# Patient Record
Sex: Male | Born: 1945 | Race: White | Hispanic: No | Marital: Married | Smoking: Never smoker
Health system: Southern US, Community
[De-identification: ages and names within clinical notes are randomized; demographics above are authoritative.]

## PROBLEM LIST (undated history)

## (undated) DIAGNOSIS — C9110 Chronic lymphocytic leukemia of B-cell type not having achieved remission: Secondary | ICD-10-CM

## (undated) DIAGNOSIS — Q231 Congenital insufficiency of aortic valve: Secondary | ICD-10-CM

## (undated) DIAGNOSIS — I2510 Atherosclerotic heart disease of native coronary artery without angina pectoris: Secondary | ICD-10-CM

## (undated) DIAGNOSIS — Z951 Presence of aortocoronary bypass graft: Secondary | ICD-10-CM

## (undated) HISTORY — DX: Presence of aortocoronary bypass graft: Z95.1

## (undated) HISTORY — DX: Chronic lymphocytic leukemia of B-cell type not having achieved remission: C91.10

## (undated) HISTORY — DX: Congenital insufficiency of aortic valve: Q23.1

## (undated) HISTORY — DX: Atherosclerotic heart disease of native coronary artery without angina pectoris: I25.10

---

## 1989-10-30 DIAGNOSIS — I2510 Atherosclerotic heart disease of native coronary artery without angina pectoris: Secondary | ICD-10-CM

## 1989-10-30 HISTORY — DX: Atherosclerotic heart disease of native coronary artery without angina pectoris: I25.10

## 1994-10-30 DIAGNOSIS — Q2381 Bicuspid aortic valve: Secondary | ICD-10-CM

## 1994-10-30 DIAGNOSIS — Q231 Congenital insufficiency of aortic valve: Secondary | ICD-10-CM

## 1994-10-30 HISTORY — DX: Congenital insufficiency of aortic valve: Q23.1

## 1994-10-30 HISTORY — DX: Bicuspid aortic valve: Q23.81

## 1999-04-20 ENCOUNTER — Encounter: Payer: Self-pay | Admitting: Endocrinology

## 1999-04-20 ENCOUNTER — Ambulatory Visit (HOSPITAL_COMMUNITY): Admission: RE | Admit: 1999-04-20 | Discharge: 1999-04-20 | Payer: Self-pay | Admitting: Endocrinology

## 2001-04-03 ENCOUNTER — Other Ambulatory Visit: Admission: RE | Admit: 2001-04-03 | Discharge: 2001-04-03 | Payer: Self-pay | Admitting: Oncology

## 2004-08-30 ENCOUNTER — Ambulatory Visit: Payer: Self-pay | Admitting: Oncology

## 2004-09-15 ENCOUNTER — Ambulatory Visit: Payer: Self-pay | Admitting: Family Medicine

## 2004-12-06 ENCOUNTER — Ambulatory Visit: Payer: Self-pay | Admitting: Family Medicine

## 2005-02-03 ENCOUNTER — Ambulatory Visit: Payer: Self-pay | Admitting: Family Medicine

## 2005-02-07 ENCOUNTER — Ambulatory Visit: Payer: Self-pay | Admitting: Family Medicine

## 2005-03-16 ENCOUNTER — Ambulatory Visit: Payer: Self-pay | Admitting: Hematology and Oncology

## 2005-08-18 ENCOUNTER — Ambulatory Visit: Payer: Self-pay | Admitting: Family Medicine

## 2005-09-05 ENCOUNTER — Ambulatory Visit: Payer: Self-pay | Admitting: Oncology

## 2005-09-28 ENCOUNTER — Ambulatory Visit: Payer: Self-pay | Admitting: Family Medicine

## 2005-10-12 ENCOUNTER — Ambulatory Visit: Payer: Self-pay | Admitting: Family Medicine

## 2005-10-26 ENCOUNTER — Ambulatory Visit: Payer: Self-pay | Admitting: Family Medicine

## 2005-11-28 ENCOUNTER — Ambulatory Visit: Payer: Self-pay | Admitting: Family Medicine

## 2006-02-20 ENCOUNTER — Ambulatory Visit: Payer: Self-pay | Admitting: Oncology

## 2006-08-07 ENCOUNTER — Ambulatory Visit: Payer: Self-pay | Admitting: Oncology

## 2006-11-07 ENCOUNTER — Ambulatory Visit: Payer: Self-pay | Admitting: Cardiology

## 2006-11-07 ENCOUNTER — Inpatient Hospital Stay (HOSPITAL_COMMUNITY): Admission: AD | Admit: 2006-11-07 | Discharge: 2006-11-13 | Payer: Self-pay | Admitting: Cardiology

## 2006-11-28 ENCOUNTER — Ambulatory Visit: Payer: Self-pay

## 2007-01-22 ENCOUNTER — Ambulatory Visit: Payer: Self-pay | Admitting: Oncology

## 2007-02-13 ENCOUNTER — Ambulatory Visit: Payer: Self-pay | Admitting: Internal Medicine

## 2007-07-09 ENCOUNTER — Ambulatory Visit: Payer: Self-pay | Admitting: Oncology

## 2007-12-18 ENCOUNTER — Ambulatory Visit: Payer: Self-pay

## 2008-04-17 ENCOUNTER — Ambulatory Visit: Payer: Self-pay | Admitting: Internal Medicine

## 2009-02-18 ENCOUNTER — Encounter (INDEPENDENT_AMBULATORY_CARE_PROVIDER_SITE_OTHER): Payer: Self-pay | Admitting: *Deleted

## 2009-04-03 DIAGNOSIS — I2510 Atherosclerotic heart disease of native coronary artery without angina pectoris: Secondary | ICD-10-CM | POA: Insufficient documentation

## 2009-04-03 DIAGNOSIS — I4891 Unspecified atrial fibrillation: Secondary | ICD-10-CM

## 2009-04-06 ENCOUNTER — Ambulatory Visit: Payer: Self-pay | Admitting: Internal Medicine

## 2009-10-11 ENCOUNTER — Encounter: Payer: Self-pay | Admitting: Internal Medicine

## 2009-10-11 ENCOUNTER — Ambulatory Visit: Payer: Self-pay

## 2010-05-13 ENCOUNTER — Ambulatory Visit: Payer: Self-pay | Admitting: Internal Medicine

## 2010-12-01 NOTE — Procedures (Signed)
Summary: [facility]iology Device Clinic    Allergies: No Known Drug Allergies  PPM Specifications Following MD:  Sherryl Manges, MD     Referring MD:  DHATT PPM Vendor:  Medtronic     PPM Model Number:  [id number]     PPM Serial Number:  [id number] PPM DOI:  11/09/2006     PPM Implanting MD:  Sherryl Manges, MD  Lead 1    Location: RA     DOI: 11/09/2006     Model #: 4098     Serial #: [id number]     Status: active Lead 2    Location: RV     DOI: 11/09/2006     Model #: 6213     Serial #: [id number]     Status: active  Magnet Response Rate:  BOL 85 ERI 65  Indications:  BRADY   PPM Follow Up Remote Check?  No Battery Voltage:  2.77 V     Battery Est. Longevity:  5 YEARS     Pacer Dependent:  Yes       PPM Device Measurements Atrium  Amplitude: 2.8 mV, Impedance: 406 ohms,  Right Ventricle  Impedance: 549 ohms, Threshold: 0.5 V at 0.4 msec  Episodes MS Episodes:  357     Percent Mode Switch:  90.9%     Coumadin:  Yes Ventricular High Rate:  0     Atrial Pacing:  5.0%     Ventricular Pacing:  99.3%  Parameters Mode:  DDIR     Lower Rate Limit:  60     Upper Rate Limit:  120 Paced AV Delay:  150     Sensed AV Delay:  OFF Next Cardiology Appt Due:  10/30/2010 Tech Comments:  No parameter changes.  Device function normal.  A-fib with controlled  ventricular rates, + coumadin.  ROV 6 months [facility]. Altha Harm, LPN  May 13, 2010 9:45 AM

## 2010-12-01 NOTE — Cardiovascular Report (Signed)
Summary: Office Visit   Office Visit   Imported By: Roderic Ovens 11/04/2009 14:01:29  _____________________________________________________________________  External Attachment:    Type:   Image     Comment:   External Document

## 2010-12-27 ENCOUNTER — Encounter (INDEPENDENT_AMBULATORY_CARE_PROVIDER_SITE_OTHER): Payer: PRIVATE HEALTH INSURANCE

## 2010-12-27 ENCOUNTER — Encounter: Payer: Self-pay | Admitting: Internal Medicine

## 2010-12-27 DIAGNOSIS — I498 Other specified cardiac arrhythmias: Secondary | ICD-10-CM

## 2011-01-05 NOTE — Procedures (Signed)
Summary: Cardiology Device Clinic  Medications Added METOPROLOL SUCCINATE 100 MG XR24H-TAB (METOPROLOL SUCCINATE) take one and one half by mouth daily CRESTOR 20 MG TABS (ROSUVASTATIN CALCIUM) one by mouth daily      Allergies Added: NKDA  Current Medications (verified): 1)  Isosorbide Mononitrate Cr 30 Mg Xr24h-Tab (Isosorbide Mononitrate) .... Take 3 Tablets Once Daily 2)  Coumadin 4 Mg Tabs (Warfarin Sodium) .... As Directed 3)  Lantus 100 Unit/ml Soln (Insulin Glargine) .... Use As Directed 4)  Aspirin 81 Mg Tabs (Aspirin) .... Take One Tablet Once Daily 5)  Lipitor 40 Mg Tabs (Atorvastatin Calcium) .... Take One Tablet Once Daily 6)  Metoprolol Succinate 100 Mg Xr24h-Tab (Metoprolol Succinate) .... Take One and One Half By Mouth Daily 7)  Wellbutrin Sr 200 Mg Xr12h-Tab (Bupropion Hcl) .... Take One Tablet Once Daily 8)  Fish Oil  Oil (Fish Oil) .... Take Once Daily 9)  Lisinopril 20 Mg Tabs (Lisinopril) .... Take One Tablet Once Daily 10)  Humalog 100 Unit/ml Soln (Insulin Lispro (Human)) .... As Directed 11)  Crestor 20 Mg Tabs (Rosuvastatin Calcium) .... One By Mouth Daily  Allergies (verified): No Known Drug Allergies  PPM Specifications Following MD:  Sherryl Manges, MD     Referring MD:  DHATT PPM Vendor:  Medtronic     PPM Model Number:  [id number]     PPM Serial Number:  [id number] PPM DOI:  11/09/2006     PPM Implanting MD:  Sherryl Manges, MD  Lead 1    Location: RA     DOI: 11/09/2006     Model #: [id number]     Serial #: [id number]     Status: active Lead 2    Location: RV     DOI: 11/09/2006     Model #: 3875     Serial #: [id number]     Status: active  Magnet Response Rate:  BOL 85 ERI 65  Indications:  BRADY   PPM Follow Up Pacer Dependent:  Yes      Episodes Coumadin:  Yes  Parameters Mode:  DDIR     Lower Rate Limit:  60     Upper Rate Limit:  120 Paced AV Delay:  150     Sensed AV Delay:  OFF Tech Comments:  See PaceArt

## 2011-01-26 NOTE — Cardiovascular Report (Signed)
Summary: Office Visit   Office Visit   Imported By: Roderic Ovens 01/17/2011 09:08:08  _____________________________________________________________________  External Attachment:    Type:   Image     Comment:   External Document

## 2011-03-14 NOTE — Assessment & Plan Note (Signed)
[facility]                         ELECTROPHYSIOLOGY OFFICE NOTE   NAME:Cobbins, HIROYUKI OZANICH                         MRN:          [id number]  DATE:04/17/2008                            DOB:          Dec 19, 1945    HISTORY OF PRESENT ILLNESS:  Mr. Proehl is seen in followup for his  pacemaker implanted about a year and half ago.  He saw Dr. Sherlyn Lick a few  weeks ago, who told that his rhythm was not normal.  As noted, he came  in without significant changes in his exercise capacity.   CURRENT MEDICATIONS:  Include isosorbide 90, Coumadin, Lantus, Humalog,  metoprolol 50 b.i.d., bupropion, and lisinopril.   PHYSICAL EXAMINATION:  VITAL SIGNS:  Today, his blood pressure was  96/60, his pulse was 72, but his blood pressures at home had been in the  160s/80s.  LUNGS:  His lungs were clear.  NECK:  His neck veins were flat.  HEART:  His heart sounds were regular with a mechanical S2 and an early  systolic murmur.  ABDOMEN:  Soft with active bowel sounds.  EXTREMITIES:  Femoral pulses were not examined.  Distal pulses were  intact.  There was no clubbing, cyanosis, or edema.  NEUROLOGICAL:  Grossly normal.   Interrogation of the Medtronic Adapta pulse generator demonstrates a P-  wave of 2.8 with impedance of 510.  This was a fibrillation wave.  The R-  wave was 8 with impedance of 525 with threshold of 0.5 and 0.4.  Battery  voltage was 2.77.   IMPRESSION:  1. Atrial fibrillation - not persistent.  2. Bradycardia, status post pacemaker.  3. Aortic valve replacement.  4. Coronary artery disease with prior bypass surgery.  5. CLL.  6. Diabetes.   Mr. Harb has atrial fibrillation.  He has not had significant symptoms  with it as best as I can tell.  I will need to check about this with Dr.  Sherlyn Lick.  In the event that he does, cardioversion would be reasonable and  then long-term therapy would be considered in the context of his  comorbidities.   I will  plan to talk to Dr. Sherlyn Lick about him on Monday.   For now, his pacemaker was functioning well and we let him go home.    Duke Salvia, MD, [facility]  Electronically Signed   SCK/MedQ  DD: 04/17/2008  DT: 04/18/2008  Job #: [id number]

## 2011-03-14 NOTE — Letter (Signed)
May 13, 2010    Dr. Lenard Cunningham Dhatt  [address]  [place], [place]  [address]   RE:  [name], [name]  MRN:  [id number]  /  DOB:  Jan 31, 1946   Dear [name]:   I hope this letter finds you and your family well this summer.  Mr.  Cunningham was seen today in followup for pacemaker implantation for sinus  node dysfunction in the setting of ischemic heart disease and prior  bypass surgery and aortic valve replacement.  The patient has no  significant complaints.  He denies chest pain, shortness of breath or  fatigue.   He does snore.  He is treated for sleep apnea and was recently adjusted  with his mask.  This is notable because as I walked into the examination  room, he was asleep.   On examination, his blood pressure was mildly elevated today at 155/79.  His pulse was 62.  His weight was 210.  His neck veins were flat.  His  lungs were clear.  Heart sounds were regular without murmurs or gallops.  Extremities had trace edema.   His medications, I should note, include isosorbide 90, Coumadin, Lantus,  aspirin, Crestor, metoprolol succinate 150, Wellbutrin, lisinopril 20  and Humalog insulin.   Interrogation of his Medtronic adapter pulse generator demonstrates that  he is ventricularly paced nearly 100% of the time.  He is in permanent  atrial fibrillation.  His heart rate excursion was somewhat blunted.   IMPRESSION:  1. Atrial fibrillation - permanent with a blunted ventricular      response.  2. Status post pacer for the above.  3. Ischemic heart disease with normal left ventricular function prior      to aortic valve replacement.  4. Obstructive sleep apnea with some ongoing daytime somnolence.   [name], [name] is doing quite well.  We did reprogram his device to  try to improve his heart rate excursion.  He says his sleep apnea mask  was adjusted within the year but, based on coming in and seeing him  asleep in the exam room, I wonder whether it may not be worth having  somebody look at that again.   We will see him again in 6 months' time.    Sincerely,      Duke Salvia, MD, [facility]  Electronically Signed    SCK/MedQ  DD: 05/13/2010  DT: 05/13/2010  Job #: [id number]

## 2011-03-17 NOTE — Discharge Summary (Signed)
[name], [name] NO.:  [id number]   MEDICAL RECORD NO.:  [id number]          PATIENT TYPE:  INP   LOCATION:  4713                         FACILITY:  [facility]   PHYSICIAN:  Duke Salvia, MD, FACCDATE OF BIRTH:  Mar 05, 1946   DATE OF ADMISSION:  11/07/2006  DATE OF DISCHARGE:  11/13/2006                               DISCHARGE SUMMARY   CARDIOLOGIST:  Dr. Thereasa Solo Dhatt in [place].   ELECTROPHYSIOLOGIST:  Dr. Sherryl Manges.   PRIMARY CARE PHYSICIAN:  Dr. Dina Rich.   REASON FOR ADMISSION:  Chest pain and shortness of breath.   DISCHARGE DIAGNOSES:  1. Coronary artery disease, status post coronary artery bypass graft.      a.     History of PCI to the left anterior descending in 1992.  2. Cardiac catheterization this admission revealing 4 of 4 grafts      patent - medical therapy recommended.  3. Preserved left ventricular function.  4. Bicuspid aortic valve, status post aortic valve replacement 1996.  5. 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      a.     Status post permanent pacemaker implantation this admission.      b.     Status post atrial lead revisions secondary to atrial lead       dislodgment this admission.  6. Diabetes mellitus type 2 complicated by retinopathy, neuropathy and      nephropathy.  7. Chronic lymphocytic lymphoma.  8. Elevated TSH.   PROCEDURES PERFORMED THIS ADMISSION:  1. Cardiac catheterization by Dr. Shawnie Pons, November 08, 2006,      please see his dictated note for complete details.  Briefly, this      revealed continued patency of the internal mammary to the LAD with      diffuse distal LAD disease.  There was continued patency of the      saphenous vein grafts to the diagonal with diffuse diagonal      disease.  There was continued patency of the vein graft to the      obtuse marginal and the posterolateral/OM3 with mild luminal      irregularity in this graft and probable occlusion of the  distal      limb into the PDA, which itself may be undersupplied.  He was not      felt to be a candidate for PCI and medical therapy was recommended.  2. Permanent pacemaker implantation by Dr. Sherryl Manges, November 09, 2006 with a Medtronic device.  3. Atrial lead revision by Dr. Sharrell Ku secondary to atrial lead      dislodgement, November 12, 2006.   HISTORY:  [name] is a 65 year old male patient with a history of  coronary disease, status post CBG, as well as bicuspid aortic valve,  status post aortic valve replacement on chronic Coumadin therapy, who  was admitted to [facility] with complaints of numbness in his  upper left lip, left arm and left leg.  He also had complaints of chest  tightness.  His  CKs were elevated without significant MB fraction.  His  troponins were negative.  His BNP was slightly elevated as well at 461.  There was some concern that he had some mild degree of congestive heart  failure.  It was also concerning that his chest pain was consistent with  exertional angina.  He was treated with Lasix.  He underwent stress  Myoview study.  The test had to be stopped secondary to fatigue and  hypotension.  He was only to bring his heart rate from 45 to 80 beats  per minute, which was only 50% of his maximum predicted heart rate.  He  was subsequently transferred to [facility] for further  evaluation and treatment.   HOSPITAL COURSE:  The patient's Coumadin had been held and his INR was  subtherapeutic and he was taken for cardiac catheterization on November 08, 2006 with Dr. Riley Kill.  Please see his dictated note for complete  details.  The anatomy of this is as described above.  He was not felt to  be a candidate for PCI of any vessel.  If PCI was considered, he would  need a drug-eluting stent given his history of diabetes and this would  be difficult given his need for longterm Coumadin.  Therefore, medical  therapy was warranted.   Given his bradycardia and significant first-  degree AV block, out EP service was asked to see the patient.  The  patient was set up for an MRA for his head  and neck.  This revealed  intracranial atherosclerotic changes without significant carotid or  basilar atherosclerotic change.  This was done in response to the  patient's recent history of left-sided numbness.  Dr. Graciela Husbands saw the  patient for our EP service.  He recommended the MRA as noted above.  He  also recommended permanent pacemaker implantation and medical therapy  for his coronary disease with increased beta blocker after pacemaker  implantation.  Pacemaker was implanted by Dr. Graciela Husbands on November 09, 2006  with a Medtronic device.  The patient's Coumadin was restarted.  On  interrogation of the pacemaker on January 12, it was noted that the  atrial lead appeared to have dislodged.  Dr. Ladona Ridgel saw the patient and  concurred and set the patient up for atrial lead revision on November 12, 2006.  Atrial lead revision was carried out by Dr. Ladona Ridgel on November 12, 2006.  Interrogation of the pacemaker postoperatively indicated that it  was functioning appropriately.  The patient was seen in followup by Dr.  Graciela Husbands on November 13, 2006.  He felt that the patient was stable enough  for discharge to home.  He would not need any bridging heparin or  Lovenox as his prosthetic valve was in the aortic valve position.  He  did recommend a high dose of Coumadin 15 mg tonight, as well as 15 mg  tomorrow and then resume his regular regimen.  The patient would need to  have his INR followed up on Friday, January 18, with his primary care  physician who usually follows his Coumadin, that is Dr. Sol Passer.  The  patient, at discharge, was noted to still be on Lasix.  This was started  when he was at [facility] for suspected congestive heart failure. His creatinine and BUN were noted to be elevated.  Therefore, his Lasix  was discontinued at  discharge and it was recommended that he have a BMET  drawn when he sees his  primary care physician in a couple of days to  have his INR drawn.  He has been provided with a prescription to take to  his primary care physician and that lab result can be called to Dr.  Graciela Husbands.  The patient was started on Lipitor when he was at [facility].  This is continued at discharge.  It is also recommended he be  on an aspirin and this continued at discharge.  His quinapril was  previously 40 mg a day.  This was adjusted at [facility] and he  is currently on enalapril 5 mg a day.  He has been provided with a new  prescription for enalapril 5 mg a day and has been asked to continue  this dosage for now.  His Toprol has been increased to 150 mg a day and  he has been provided with a new prescription for this as well.  As noted  above, Dr. Graciela Husbands saw the patient and felt he was stable enough for  discharge to home today.   LABS AND ANCILLARY DATA:  On January 12, white count 8100, hemoglobin  12.9, hematocrit 36.9, platelet count 154,000.  INR, at discharge, 1.7.  On January 13, sodium 132, potassium 3.9, chloride 104, CO2 23, glucose  151, BUN 40, creatinine 1.63.  Total bilirubin, on January 11, 1.6,  alkaline phosphatase 56, AST 28, ALT 60, total protein 6.3, albumin 3.5,  calcium 8.6, magnesium 2.2.  BNP, on January 9, 130.  Total cholesterol  302, triglycerides 243, HDL 33, LDL 220.  TSH 6.173.  Chest x-ray,  November 13, 2006, no complications status post pacemaker implantation.  Cardiac enlargement without failure.  Chest x-ray, November 10, 2006, no  evidence for pneumothorax after pace replacement.  Cardiac enlargement  without failure.   DISCHARGE MEDICATIONS:  1. Isosorbide mononitrate 90 mg daily.  2. Toprol-XL 100 mg 1-1/2 tablets daily to make 150 mg dose.  3. Enalapril 5 mg daily (this is new and takes the place of his      quinapril).  4. Coumadin 15 mg tonight and 15 mg  tomorrow November 14, 2006 and then      he is to resume his previous dose of Coumadin.  5. Lantus as taken at home.  6. Humalog as directed.  7. Bupropion as taken at home.  8. Aspirin 81 mg daily.  9. Lipitor 80 mg q.h.s.   DIET:  Low-fat, low-sodium diabetic diet.   ACTIVITY:  He is to follow the instructions provided to him that go over  activity post pacemaker implantation.   WOUND CARE:  The patient has also been provided with instructions on  what to do for wound care post pacemaker implantation.  If he notes any  groin, swelling, bleeding, bruising or fever he is to call our office.   FOLLOWUP:  1. The patient is to see Dr. Sol Passer his primary care physician, Friday,      November 16, 2006 to check his PT and INR.  He has been asked to      call for an appointment.  He has also been provided with a      prescription to take that day to have a BMET drawn to reassess his     renal function.  2. He should follow up with Dr. Sherlyn Lick in 1-2 weeks.  He has been asked      to call him for an appointment.  3. He will see the [facility] for  a wound check on Thursday,      January 24, at 8:30 a.m. at our [place] office in [place].  4. He will see Dr. Graciela Husbands in 3 months and the office will contact him      for an appointment.   The patient has been advised to stop taking quinapril and Actos for now.  He should discuss with Dr. Sol Passer whether or not to restart his Actos.   Total physician and PA time, on this discharge, 90 minutes.      Tereso Newcomer, PA-C      Duke Salvia, MD, [facility]  Electronically Signed    SW/MEDQ  D:  11/13/2006  T:  11/14/2006  Job:  [id number]   cc:   Dina Rich

## 2011-03-17 NOTE — Op Note (Signed)
NAMEJUERGEN, HARDENBROOK NO.:  [id number]   MEDICAL RECORD NO.:  [id number]          PATIENT TYPE:  INP   LOCATION:  4713                         FACILITY:  [facility]   PHYSICIAN:  Duke Salvia, MD, FACCDATE OF BIRTH:  Aug 05, 1946   DATE OF PROCEDURE:  11/09/2006  DATE OF DISCHARGE:                               OPERATIVE REPORT   PREOPERATIVE DIAGNOSIS:  First degree AV block and significant sinus  node dysfunction with chronotropic incompetence.   POSTOPERATIVE DIAGNOSIS:  First degree AV block and significant sinus  node dysfunction with chronotropic incompetence.   Following obtaining informed consent, the patient was brought to the  catheterization laboratory and placed on the fluoroscopic table in the  supine position.  After routine prep and drape of the left upper chest,  lidocaine was infiltrated in the prepectoral subclavicular region.  An  incision was made and carried down to the layer of the prepectoral  fascia using electrocautery and sharp dissection.  A pocket was formed  similarly.  Hemostasis was obtained.   Thereafter, attention was turned to gaining access to the extrathoracic  left subclavian vein which was accomplished without difficulty without  the aspiration of air or puncture of the artery.  Two separate  venipunctures were accomplished.  Guidewires were placed and retained.  7 French sheaths were then placed sequentially through which were passed  a Medtronic 5076, 58 cm active fixation ventricular lead; this lead was  marked with a tie, the serial number [id number].  The Medtronic 5076, 52  cm active fixation atrial lead had a serial number of [id number].  Under  fluoroscopic guidance, these were manipulated to the right ventricular  septum and the right atrial appendage remnant respectively where the  bipolar R wave was 16.7 with a pacing impedance of 1064, a threshold 0.2  volts at 0.5 milliseconds, current threshold 1.5 MA.  The  bipolar P-wave  was 4.8 mV with a pacing impedance of 948 ohms, a threshold 0.4 volts at  0.5 milliseconds with current threshold 1.7 MA.   These leads were secured to the prepectoral fascia attached to a  Medtronic Adaptic DDDR-I pulse generator, serial number [id number].  Ventricular pacing and then AV pacing were identified.  The pocket was  copiously irrigated with antibiotic containing saline solution.  The  leads and pulse generator were then placed in the pocket and secured to  the prepectoral fascia.  Hemostasis was assured.  The wound was closed  in three layers in a normal fashion.  The wound was washed and dried and  a benzoin and Steri-Strip dressing was applied.  Needle counts, sponge  counts and instrument counts were correct at the end of the procedure  according to the staff.  The patient tolerated the procedure without  apparent complication.      Duke Salvia, MD, [facility]  Electronically Signed    SCK/MEDQ  D:  11/09/2006  T:  11/10/2006  Job:  [id number]   cc:   Harl Bowie, M.D.

## 2011-03-17 NOTE — Op Note (Signed)
[name], PROUT NO.:  [id number]   MEDICAL RECORD NO.:  [id number]          PATIENT TYPE:  INP   LOCATION:  4713                         FACILITY:  [facility]   PHYSICIAN:  Doylene Canning. Ladona Ridgel, MD    DATE OF BIRTH:  [birth year]   DATE OF PROCEDURE:  11/12/2006  DATE OF DISCHARGE:                               OPERATIVE REPORT   PROCEDURE PERFORMED:  Atrial lead revision.   INDICATIONS:  Atrial lead dislodgement.   INTRODUCTION:  The patient is a 65 year old male with history of sinus  node dysfunction and marked first-degree A-V block and ischemic heart  disease with preserved LV function, who underwent permanent pacemaker  insertion for all the above with his postoperative procedure complicated  by atrial lead dislodgement.  He is now referred for atrial lead  revision.   PROCEDURE:  After informed consent was obtained, the patient was taken  to the diagnostic EP lab in fasting state after the usual preparation  and draping, intravenous fentanyl and Midazolam was given for sedation.  30 mL lidocaine was infiltrated into the infraclavicular region at the  old pacemaker insertion site.  A 5 cm incision was carried out over this  region.  Electrocautery utilized to dissect down to the pacemaker  pocket.  The generator was removed with gentle traction and placed in  kanamycin after being disconnected from the atrial and ventricular  leads.  The ventricular lead was initially analyzed and the R-waves were  down varying between 4 and 8 mV.  The threshold, however, was less than  1 volt at 0.5 milliseconds.  Impedances were stable.  With this  demonstrated, attention then turned to the atrial lead.  It was freed up  from its sewing sleeve and its silk suture and a stylet was advanced  into the lead which had migrated into the right ventricle.  The helix  was retracted back into the body of the lead and the J stylet was  advanced into the lead and mapping was again  carried out in the right  atrium after the lead had been retracted back.  On the anterolateral  wall of the right atrium, the P-waves were measured 5 mV and with the  lead actively fixed, the pacing impedance was 573 ohms with a threshold  0.9 volts at 0.5 milliseconds.  There is very large injury current  demonstrated.  10 volt pacing did not stimulate the diaphragm.  With  these satisfactory parameters, the lead was secured to the subpectoralis  fascia with a figure-of-eight silk suture and the sewing sleeve was also  secured with silk suture.  Kanamycin irrigation was utilized to irrigate  the pocket and the Medtronic Adapta dual-chamber pacemaker which had  been previously disconnected was reattached to the atrial and  ventricular pacing leads and placed in the subcutaneous pocket.  Generator was secured with silk suture.  Additional kanamycin was  utilized to irrigate the pocket and the incision then closed with layer  of 2-0 Vicryl, followed by layer of 3-0 Vicryl, followed by layer of 4-0  Vicryl.  Benzoin  was painted on the skin and Steri-Strips were applied  and pressure dressing was placed.  The patient was returned to his room  in satisfactory condition.   COMPLICATIONS:  There were no immediate procedure complications.   RESULTS:  This demonstrates successful atrial lead revision in a patient  was sinus node dysfunction and ischemic heart disease.      Doylene Canning. Ladona Ridgel, MD  Electronically Signed     GWT/MEDQ  D:  11/12/2006  T:  11/12/2006  Job:  [id number]   cc:   Harl Bowie, M.D.  Duke Salvia, MD, [facility]

## 2011-03-17 NOTE — Consult Note (Signed)
[name], ROUPP NO.:  [id number]   MEDICAL RECORD NO.:  [id number]          PATIENT TYPE:  INP   LOCATION:  4713                         FACILITY:  [facility]   PHYSICIAN:  Duke Salvia, MD, FACCDATE OF BIRTH:  [birth year]   DATE OF CONSULTATION:  11/09/2006  DATE OF DISCHARGE:                                 CONSULTATION   Thank you very much for asking me to see [name] in  electrophysiological consultation because of bradycardia and first-  degree AV block.   [name] is a 65 year old gentleman with ischemic heart disease, status  post bypass surgery and valve replacement in 1996 and a PCI prior to  that.  He has normal left ventricular function.  Her has longstanding  diabetes.   About 3 months prior to presentation he started noticing progressive  exercise intolerance.  Over the week, however, prior to presentation he  went to bed and had noted significant chest tightness and difficulty  breathing.  This was particularly notable when flat.   The next morning when he got up he noticed left-sided numbness face,  tongue, arm and leg that resolved over a few minutes.  He was told at  some point to get up slowly and this seemed to ameliorated symptoms.  Because of the aforementioned symptoms, he underwent stress testing and  evaluation done at [facility] and this demonstrated normal left  ventricular function.  A stress test, however, was interrupted because  of inappropriate blood pressure response in that there was no blood  pressure augmentation; the patient also had significant chronotropic  incompetence with max heart rate 75 beats per minute.  He also underwent  MRI of the brain which was unrevealing, carotids of the neck which were  not striking.  He was referred for catheterization which was undertaking  yesterday by Dr. Riley Kill.  This demonstrated patent LIMA to his LAD vein  grafts to his diagonal and OM-2 and OM-3.  However, there  is significant  distal disease in the LAD and the diagonal and a nonrevascularizable  dominant third OM.  It was felt, however, that he was best a candidate  for medical therapy.   It should be noted also that as he presented to [facility] his  blockers were reduced because of the concern about his brady  arrhythmias.   PAST MEDICAL HISTORY:  In addition to the above is notable for  1. 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  2. CLL - partially treated in remission.  3. He also has retinopathy and probable nephropathy.   ALLERGIES:  HE HAS NO KNOWN DRUG ALLERGIES.   SOCIAL HISTORY:  He is married with one son.  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   REVIEW OF SYSTEMS:  In addition to above is notable for  1. Thrombocytopenia.  2. Erectile dysfunction.  3. Chronic depression with some obsessive-compulsive disorder.   FAMILY HISTORY:  Noncontributory.   CURRENT MEDICATIONS:  1. Insulin.  2. Lipitor 80.  3. Lasix 20.  4. Nitroglycerin 1 inch q.6h.  5. Aspirin.  6. Enalapril 5.  7. Warfarin.   PHYSICAL EXAMINATION:  GENERAL APPEARANCE:  He is an older Caucasian  male appearing his stated age of 65.  VITAL SIGNS:  His blood pressure is 160/68 with pulse of 63 lying,  sitting it was 152/68 with a pulse of 66, sitting at 0 minutes it was  132/50 with pulse of 75, standing at 2 minutes it was 144/62 with a  pulse of 80 and standing at sitting 5 minutes the blood pressure was  about 140 again systolic.  HEENT:  No icterus or xanthoma.  NECK:  The neck veins are flat.  The carotids are brisk and full  bilaterally without bruits.  BACK:  Without kyphosis or scoliosis.  LUNGS:  Clear.  HEART:  Sounds were regular without murmurs or gallops.  S1 was  diminished.  There was a mechanical S2.  ABDOMEN:  Soft and protuberant.  There is no midline pulsation or  hepatomegaly.  VASCULAR:  Femoral pulses were 2+, distal pulses were  trace.  There is  no clubbing, cyanosis or edema.  NEUROLOGICAL:  Grossly normal though adenopathy exam was not undertaken.   Electrocardiogram dated yesterday demonstrated sinus rhythm at 45 beats  per minute, intervals were 0.46/0.13/0.50.   IMPRESSION:  1. First-degree AV block that is profound.  2. Sinus node dysfunction.  3. Exercise intolerance related to #1 and 2.  4. Coronary artery disease.      a.     Prior coronary artery bypass grafting with prior AVR.      b.     Nonrevascularizable three-vessel disease.      c.     Normal ejection fraction.  5. Orthostatic left-sided numbness and lightheadedness.  6. Diabetes.  7. 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  8. Chronic lymphocytic leukemia.  9. Psychiatric history of depression with some obsessive-compulsive      traits.   DISCUSSION:  [name] has significant sinus node dysfunction and first-  degree AV block which is likely contributing hemodynamically to his  exercise intolerance and I think backup ready pacing will be important.  This will also allow for aggressive management of his coronary artery  disease as the beta blocker has subsequently been held.   However, I am bothered prior to device implantation by the left-sided  numbness with changes in position and I wonder whether there may not be  some focal flow limiting lesion in his right brain circulation.  I have  reviewed this with Dr. Pearlean Brownie and we will plan to undertake evaluation  thereof.     To that end, we will  1. Proceed with MRA of head and neck with neurology consult if needed.  2. Pacer to follow #1.  3. Medical therapy, that is the resumption of beta blockers following      #2.  4. Follow-up patient kind of closely.  5. Resume Coumadin.  6. Will need to limit the upper rate of his pacemaker given his      extensive coronary artery disease and would estimate that we would     probably have it programmed to something like 50/90.   I have  reviewed the above with the patient.  I have also reviewed the  potential benefits well as potential risks of device implantation  including, but not limited to death, perforation, infection, limb lead  dislodgement.  He understands these risks and is willing to proceed.  Duke Salvia, MD, [facility]  Electronically Signed     SCK/MEDQ  D:  11/09/2006  T:  11/09/2006  Job:  [id number]   cc:   Harl Bowie, M.D.  Dina Rich  EP Lab  [facility]

## 2011-03-17 NOTE — H&P (Signed)
[name], [name] NO.:  [id number]   MEDICAL RECORD NO.:  [id number]          PATIENT TYPE:  INP   LOCATION:  4713                         FACILITY:  [facility]   PHYSICIAN:  Jesse Sans. Wall, MD, FACCDATE OF BIRTH:  27-Feb-1946   DATE OF ADMISSION:  11/07/2006  DATE OF DISCHARGE:                              HISTORY & PHYSICAL   CHIEF COMPLAINT:  Numbness, tingling, shortness of breath and chest  tightness.   HISTORY OF PRESENT ILLNESS:  A 65 year old male with a history of  coronary artery disease status post CABG, AVR presents with a 4-day  history of vague chest pain and discomfort at rest, mild shortness of  breath and left leg weakness.  He was admitted to [facility] with  mildly elevated CKs and MBs, but negative troponins and elevated BNP  mildly at 461 with an echo, which showed normal ejection fraction, mild  MR and TR, normal function in aortic valve prosthesis, mildly elevated  right ventricular systolic pressures of 40 mmHg.  He underwent a brief  treadmill test; however, this was stopped due to fatigue and  hypotension.  Of note, his heart rate was 45 beats per minute upon  arrival to the hospital with a substantial first degree AV block rate at  250 milliseconds.  During exercise, he only increased his heart rate to  80 beats per minute, 50% of his maximum predicted heart rate.   ALLERGIES:  NO KNOWN DRUG ALLERGIES.   MEDICATIONS AT HOME:  Imdur, quinapril, Toprol-XL, Wellbutrin, Coumadin,  Lantus, insulin, Actos.   PAST MEDICAL HISTORY:  1. Bicuspid aortic valve, status post aortic valve replacement, 1996.  2. Coronary artery disease, status post PCI to left anterior      descending in 1991.  3. Status post coronary artery bypass graft with LIMA to left anterior      descending, vein grafts to first through third obtuse marginal and      first diagonal.  4. Type 2 diabetes, complicated by retinopathy, neuropathy and      nephropathy.  5. CLL apparently partially treated due to financial issues.   SOCIAL HISTORY:  Lives in [place] with his wife.  He used to be a  [employer]chant [employer] officer; is now Health and safety inspector.   FAMILY HISTORY:  Mother died of pancreatic cancer at age 48.  Father  died of coronary artery disease at age 34.   REVIEW OF SYSTEMS:  Otherwise, unremarkable besides those stated in the  HPI.   PHYSICAL EXAMINATION:  VITAL SIGNS:  Temperature is 98.7.  Pulse is 48.  Respiratory rate is 18.  Blood pressure is 130/70.  GENERAL:  He is a very pleasant man.  He is in no apparent distress.  NECK:  Supple.  Jugular venous pulsations are normal.  No carotid bruits  appreciated.  HEENT EXAM:  Demonstrates normocephalic, atraumatic.  Pupils equally  round and reactive to light.  Extraocular muscles are intact.  LYMPHADENOPATHY:  None.  CARDIOVASCULAR EXAM:  Bradycardic S1, S2.  A 1/6 to 2/6 murmur heard at  the base.  LUNGS:  Clear  to auscultation with no adventitial sounds.  SKIN:  Shows no lesions.  ABDOMEN:  Soft, nontender.  Liver scan is less than 4 cm.  EXTREMITIES:  Show no clubbing, cyanosis or edema.  NEUROLOGIC EXAM:  Alert and oriented x3.  Cranial nerves II-XII grossly  intact.  Strength 5/5 in all extremities and axial groups.  Normal  sensation throughout.  Normal cerebellar function.   ASSESSMENT:  This is a 65 year old male with numbness, tingling, chest  pain, shortness of breath and history of coronary artery disease in the  presence of brady arrhythmia.  Etiology of patient's symptoms is likely  multifactorial; however, of concern is the possibility of an anginal  equivalent given his history of diabetes and history of coronary artery  disease.  This will likely require further investigation with  angiography when the INR permits.  Also the patient appears to have  chronotropic incompetence, which may be a major contributor to his  symptoms.  While he is on Toprol-XL at home; therefore, medications   could be contributing to his brady arrhythmia, these have been held for  at least 72 hours.  Therefore, unlikely that we are still seeing a beta  blocker effect.  Additionally, in perusing his stress test results, it  appears that he had a relatively flat response to exercise suggesting  chronotropic incompetence.  Should the patient require a pacemaker, the  CLL issues will need to be clarified with regards to need for MRIs,  which will be contraindicated with the pacemaker, as well as radiation  therapy, which may alter the location of pacemaker implantation should  it be required.   PLAN:  1. Coronary artery disease.  We will continue with Coumadin, INR was      2.1 today at [facility].  We will check an INR tonight.      Keep the patient n.p.o. in case INR tomorrow drops below 2.0, at      which time heparin will be started and patient will be placed on      the cath floor.  2. We will hold beta blockers given brady arrhythmia.  Symptomatic      treatment with nitroglycerin.  Continue aspirin.  3. Bradyarrhythmia.  Patient appears to have sinus bradycardia with a      first degree AV block.  It will be interesting to see if the      patient has worsened AV block with exercise.  The strips from his      stress test are not available to use currently.  It appears the      patient has symptomatic bradycardia; therefore, would qualify for      pacemaker treatment; however, reversible reasons for brady      arrhythmia will need to be evaluated including coronary artery      disease.  We will also check for thyroid function panel.  4. CLL.  The patient has mild anemia.  The patient may require further      evaluation for his CLL to help guide pacemaker therapy if it is      required.  5. Renal insufficiency.  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  Patient would benefit from an ACE     inhibitor provided blood pressure allows.      Daniel  B. Haithcock, MD   Electronically Signed     ______________________________  Jesse Sans. Daleen Squibb, MD, [facility]    DBH/MEDQ  D:  11/07/2006  T:  11/08/2006  Job:  [id number]

## 2011-03-17 NOTE — Cardiovascular Report (Signed)
NAMERASHAUD, YBARBO NO.:  [id number]   MEDICAL RECORD NO.:  [id number]          PATIENT TYPE:  INP   LOCATION:  4713                         FACILITY:  [facility]   PHYSICIAN:  Arturo Morton. Riley Kill, MD, FACCDATE OF BIRTH:  09/27/1946   DATE OF PROCEDURE:  11/08/2006  DATE OF DISCHARGE:                            CARDIAC CATHETERIZATION   INDICATIONS:  Mr. Elgersma is a 65 year old who underwent previous  revascularization surgery and aortic valve replacement.  He came in with  some left-sided numbness.  He has also had some chest pain.  He had an  abnormal stress test.  The current study was done to reassess his  coronary anatomy.   PROCEDURE:  1. Selective coronary arteriography.  2. Saphenous vein graft angiography.  3. Selective left internal mammary angiography.   DESCRIPTION OF THE PROCEDURE:  The patient was brought to the  catheterization laboratory, prepped and draped in the usual fashion.  His creatinine was 1.4.  His INR was 1.3, and we rechecked this in the  laboratory and it was found to be 1.3, as well.  Creatinine was also  rechecked in the laboratory and found to be 1.3.  We did the procedure  with 54 cc of contrast.  Views of both coronary arteries were obtained.  The vein grafts were injected, as well as the internal mammary.  The  patient had received previous Lovenox.  He was taken to the holding area  in satisfactory clinical condition for direct manual hemostasis.  We  used 5-French catheters to perform the procedure.   In the holding area, he was given 10 mg of hydralazine for blood  pressure control.   HEMODYNAMIC DATA:  Central aortic pressure 177/64.   ANGIOGRAPHIC DATA:  1. The left main is free of critical disease.  2. The LAD is severely diseased with a 90% stenosis that is totally      occluded.  There is diffuse disease in the small diagonal which has      70% proximal narrowing and then diffuse disease beyond this.  3. The vein  graft to the large diagonal appears to be intact.  It      fills retrograde into a midportion of the LAD with two septal      perforators.  Leading into this retrograde is a 90% stenosis.      Distal to the diagonal graft insertion are 3 tandem lesions and a      severely diseased vessel that is about 1.5 mm at most in size,      suggesting diffuse diabetic disease.  There are 70, 90 and 90%      lesions in this diagonal branch.  4. The internal mammary to the LAD is intact.  The distal LAD,      however, has severe disease also with 50, 50 and then tandem 90%      lesions prior to the apical vessel.  5. The circumflex is a dominant vessel.  The first branch is      subtotally occluded and then totally occluded.  There is an  80-90%      lesion in the AV circumflex beyond this.  Distally, there is a      large posterolateral branch.  6. The vein graft to the large marginal is intact, and then there is a      second limb to a posterolateral branch that is intact.  The third      limb that goes to the posterior descending branch cannot be seen.      It may be gone due to competitive filling.  There fills retrograde      and also through the antegrade injection of the circumflex an 80%      stenosis likely leading into the posterolateral and posterior      descending branch.  This may not be covered by any other vascular      flow.  7. The right is a nondominant vessel with a first branch that is an RV      branch with 80% narrowing.  The AV portion of the right coronary      artery is extremely small.   CONCLUSIONS:  1. Continued patency of the internal mammary of the left anterior      descending with diffuse distal left anterior descending disease.  2. Continued patency of the saphenous vein graft to the diagonal with      diffuse diagonal disease.  3. Continued patency of the vein graft to the OM and posterolateral or      OM-3 with mild luminal irregularity in this graft and  probable      occlusion of the distal limb into the PDA which itself may be under      supplied.   DISPOSITION:  I will discuss the case with Dr. Sherlyn Lick.  This is not  optimal for percutaneous intervention.  If we were to open the AV  circumflex, it would certainly put the distal vessel at risk.  He is a  diabetic.  Moreover, he has underlying CLL and anemia and is not a good  candidate for long-term clopidogrel due to his need for Coumadin  anticoagulation.  Therefore, continued medical therapy would likely be  recommended.      Arturo Morton. Riley Kill, MD, [facility]  Electronically Signed     TDS/MEDQ  D:  11/08/2006  T:  11/08/2006  Job:  [id number]   cc:   Harl Bowie, M.D.  Thomas C. Wall, MD, [facility]  CV Laboratory

## 2011-03-17 NOTE — Assessment & Plan Note (Signed)
[facility]                         ELECTROPHYSIOLOGY OFFICE NOTE   NAME:[name], [name]                         MRN:          [id number]  DATE:02/13/2007                            DOB:          [birth year]    [name] is seen following pacemaker implantation for significant first-  degree AV block and sinus bradycardia.  He is much improved  symptomatically since this was done.   MEDICATIONS:  Reviewed and are unchanged.   PHYSICAL EXAMINATION:  VITAL SIGNS: Pulse 73.  LUNGS:  Clear.  CARDIAC:  Heart sounds are regular.  SKIN:  The wound was well healed.  EXTREMITIES: Without edema.   Interrogation of his Medtronic Kappa pulse generator demonstrates a P  wave of 5.6, impedance of 548, threshold of 0.5 at 0.4.  The R wave is  5.6 with impedance of 549, threshold of 0.5 at 0.4.  There were a couple  of mode switch episodes with durations of 4-12 hours.   IMPRESSION:  1. Paroxysmal atrial fibrillation.  2. Sinus bradycardia with profound first-degree AV block.  3. Status post pacer for the above.  4. Status post aortic valve replacement.   [name] is stable and will see him again in 9 months' time.     Duke Salvia, MD, [facility]  Electronically Signed    SCK/MedQ  DD: 02/13/2007  DT: 02/13/2007  Job #: [id number]   cc:   [name], M.D.

## 2011-03-17 NOTE — Assessment & Plan Note (Signed)
[facility]                         ELECTROPHYSIOLOGY OFFICE NOTE   NAME:HAYESCailan, General                         MRN:          [id number]  DATE:11/28/2006                            DOB:          1946-07-17    Mr. Cockerell was seen today in the clinic on November 28, 2006, for wound  check of his newly implanted Medtronic Model Number [id number], Adapta.  Date of implant of November 09, 2006, for bradycardia and first degree  heart block with atrial lead revision done on November 12, 2006.   On interrogation of this device today, the battery voltage is 2.77.  P  waves measured greater than 5.6 mV with an atrial packing threshold of  0.75 volts at 0.4 msec and an atrial lead impedance of 543 ohms.  R  waves were not measured.  He is pacemaker dependent to a rate of 30 with  a ventricular capture threshold of 0.75 volts at 0.4 msec and a  ventricular lead impedance of 637 ohms.  Steri-Strips were removed  today.  He wound was without redness or edema, and he will be seen again  in April by Dr. Graciela Husbands.      Altha Harm, LPN  Electronically Signed      Duke Salvia, MD, [facility]  Electronically Signed   PO/MedQ  DD: 11/28/2006  DT: 11/28/2006  Job #: [id number]

## 2011-04-25 ENCOUNTER — Encounter: Payer: Self-pay | Admitting: Cardiovascular Disease

## 2011-06-29 ENCOUNTER — Encounter: Payer: Self-pay | Admitting: Internal Medicine

## 2011-06-30 ENCOUNTER — Encounter: Payer: Medicare Other | Admitting: Internal Medicine

## 2012-12-05 ENCOUNTER — Encounter: Payer: Self-pay | Admitting: *Deleted

## 2013-04-02 ENCOUNTER — Ambulatory Visit (INDEPENDENT_AMBULATORY_CARE_PROVIDER_SITE_OTHER): Payer: Medicare Other | Admitting: Internal Medicine

## 2013-04-02 ENCOUNTER — Encounter: Payer: Self-pay | Admitting: Internal Medicine

## 2013-04-02 VITALS — BP 127/86 | HR 89 | Ht 67.0 in | Wt 217.0 lb

## 2013-04-02 DIAGNOSIS — I4891 Unspecified atrial fibrillation: Secondary | ICD-10-CM

## 2013-04-02 DIAGNOSIS — Z954 Presence of other heart-valve replacement: Secondary | ICD-10-CM

## 2013-04-02 DIAGNOSIS — Z950 Presence of cardiac pacemaker: Secondary | ICD-10-CM

## 2013-04-02 DIAGNOSIS — Z952 Presence of prosthetic heart valve: Secondary | ICD-10-CM | POA: Insufficient documentation

## 2013-04-02 DIAGNOSIS — I442 Atrioventricular block, complete: Secondary | ICD-10-CM | POA: Insufficient documentation

## 2013-04-02 LAB — PACEMAKER DEVICE OBSERVATION
AL THRESHOLD: 0.75 V
VENTRICULAR PACING PM: 100

## 2013-04-02 NOTE — Assessment & Plan Note (Signed)
Patient has atrial fibrillation. He is already anticoagulated secondary to his mechanical aortic valve.  Heart rate is adequately controlled given complete heart block

## 2013-04-02 NOTE — Assessment & Plan Note (Signed)
Progresses heart block now dependent; stable post pacing

## 2013-04-02 NOTE — Assessment & Plan Note (Signed)
Appropriately on aspirin and warfarin

## 2013-04-02 NOTE — Progress Notes (Signed)
Patient Care Team: Dina Rich, MD as PCP - General (Unknown Physician Specialty)   HPI  [name] is a 67 y.o. male Is seen in followup for pacemaker implanted 2008. Indication appears to the sinus node dysfunction and first degree AV block.  He also has a history of ischemic heart disease with prior PCI CABG and aortic valve replacement for bicuspid valve. He has not been seen since 2010  He follows up with cardiology in [place] but has not had pacemaker followup in 4 years.  He denies significant chest pain or shortness of breath. He was nonambulatory for long time following a injury to his foot which required prolonged healing  Past Medical History  Diagnosis Date  . Bicuspid aortic valve 1996    S/p aortic valve replacement  . Coronary artery disease 1991    S/p PCI to left anterior descending  . S/P CABG (coronary artery bypass graft)     With LIMA to left anterior descending, vein grafts to first through third obtuse marginal and first diagonal  . 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 CLL (chronic lymphoblastic leukemia)     Partially treated due to financial issues    No past surgical history on file.  Current Outpatient Prescriptions  Medication Sig Dispense Refill  . aspirin 81 MG tablet Take 81 mg by mouth daily.        Marland Kitchen atorvastatin (LIPITOR) 40 MG tablet Take 40 mg by mouth daily.        Marland Kitchen buPROPion (WELLBUTRIN SR) 200 MG 12 hr tablet Take 200 mg by mouth daily.        . fish oil-omega-3 fatty acids 1000 MG capsule Take 1 g by mouth daily.        . insulin glargine (LANTUS) 100 UNIT/ML injection Inject into the skin as directed.        . insulin lispro (HUMALOG) 100 UNIT/ML injection Inject into the skin as directed.        . isosorbide mononitrate (IMDUR) 30 MG 24 hr tablet Take 90 mg by mouth daily.        . metoprolol (TOPROL-XL) 100 MG 24 hr tablet Take 150 mg by mouth daily.        Marland Kitchen NITROSTAT 0.4 MG SL  tablet 0.4 mg every 5 (five) minutes as needed.       . warfarin (COUMADIN) 7.5 MG tablet Take 7.5 mg by mouth daily.      Marland Kitchen ZETIA 10 MG tablet 10 mg daily.       Marland Kitchen zolpidem (AMBIEN) 5 MG tablet 5 mg at bedtime as needed.       . sertraline (ZOLOFT) 50 MG tablet        No current facility-administered medications for this visit.    No Known Allergies  Review of Systems negative except from HPI and PMH  Physical Exam BP 127/86  Pulse 89  Ht 5\' 7"  (1.702 m)  Wt 217 lb (98.431 kg)  BMI 33.98 kg/m2 Well developed and well nourished in no acute distress HENT normal E scleral and icterus clear Neck Supple JVP flat; carotids brisk and full Clear to ausculation  mechanical S1 and early systolic murmur regular rate and rhythm  Soft with active bowel sounds No clubbing cyanosis none Edema Alert and oriented, grossly normal motor and sensory function Skin Warm and Dry   ECG demonstrates AV pacing  Assessment and  Plan

## 2013-04-02 NOTE — Assessment & Plan Note (Signed)
The patient's device was interrogated.  The information was reviewed. No changes were made in the programming.   We'll recheck in 6 months as he does not have accessible

## 2013-04-02 NOTE — Patient Instructions (Addendum)
Your physician recommends that you schedule a follow-up appointment in: 6 months with Dr. Klein Your physician recommends that you continue on your current medications as directed. Please refer to the Current Medication list given to you today.  

## 2013-04-17 ENCOUNTER — Encounter: Payer: Self-pay | Admitting: Internal Medicine

## 2013-10-08 ENCOUNTER — Encounter: Payer: Medicare Other | Admitting: Internal Medicine

## 2013-10-09 ENCOUNTER — Ambulatory Visit (INDEPENDENT_AMBULATORY_CARE_PROVIDER_SITE_OTHER): Payer: Medicare Other | Admitting: Internal Medicine

## 2013-10-09 ENCOUNTER — Encounter: Payer: Self-pay | Admitting: Internal Medicine

## 2013-10-09 VITALS — BP 131/71 | HR 63 | Ht 68.0 in | Wt 213.4 lb

## 2013-10-09 DIAGNOSIS — I442 Atrioventricular block, complete: Secondary | ICD-10-CM

## 2013-10-09 DIAGNOSIS — N62 Hypertrophy of breast: Secondary | ICD-10-CM | POA: Insufficient documentation

## 2013-10-09 DIAGNOSIS — Z952 Presence of prosthetic heart valve: Secondary | ICD-10-CM

## 2013-10-09 DIAGNOSIS — R079 Chest pain, unspecified: Secondary | ICD-10-CM

## 2013-10-09 DIAGNOSIS — Z950 Presence of cardiac pacemaker: Secondary | ICD-10-CM

## 2013-10-09 DIAGNOSIS — I4891 Unspecified atrial fibrillation: Secondary | ICD-10-CM

## 2013-10-09 DIAGNOSIS — Z954 Presence of other heart-valve replacement: Secondary | ICD-10-CM

## 2013-10-09 DIAGNOSIS — I495 Sick sinus syndrome: Secondary | ICD-10-CM

## 2013-10-09 LAB — MDC_IDC_ENUM_SESS_TYPE_INCLINIC
Battery Impedance: 2022 Ohm
Battery Remaining Longevity: 22 mo
Battery Voltage: 2.71 V
Brady Statistic AS VP Percent: 0 %
Brady Statistic AS VS Percent: 0 %
Date Time Interrogation Session: 20141211175725
Lead Channel Impedance Value: 436 Ohm
Lead Channel Impedance Value: 466 Ohm
Lead Channel Pacing Threshold Amplitude: 0.75 V
Lead Channel Pacing Threshold Pulse Width: 0.4 ms
Lead Channel Pacing Threshold Pulse Width: 0.4 ms
Lead Channel Setting Pacing Amplitude: 2 V
Lead Channel Setting Sensing Sensitivity: 2 mV

## 2013-10-09 NOTE — Assessment & Plan Note (Signed)
No significant recurrent atrial fibrillation

## 2013-10-09 NOTE — Assessment & Plan Note (Addendum)
Prior bypass surgery now with exertional chest discomfort multiple cardiac risk factors. We will repeat his lexi Myoview

## 2013-10-09 NOTE — Patient Instructions (Addendum)
Your physician has requested that you have a lexiscan myoview. For further information please visit [email or web address] Please follow instruction sheet, as given.  Your physician has requested that you have an echocardiogram. Echocardiography is a painless test that uses sound waves to create images of your heart. It provides your doctor with information about the size and shape of your heart and how well your heart's chambers and valves are working. This procedure takes approximately one hour. There are no restrictions for this procedure.  Your physician recommends that you continue on your current medications as directed. Please refer to the Current Medication list given to you today.  Your physician wants you to follow-up in: 6 months with device clinic.  You will receive a reminder letter in the mail two months in advance. If you don't receive a letter, please call our office to schedule the follow-up appointment.  Your physician wants you to follow-up in: 1 year with Dr. Graciela Husbands.  You will receive a reminder letter in the mail two months in advance. If you don't receive a letter, please call our office to schedule the follow-up appointment.

## 2013-10-09 NOTE — Assessment & Plan Note (Signed)
Will reassess valve as now with cehst pain

## 2013-10-09 NOTE — Assessment & Plan Note (Signed)
The patient's device was interrogated.  The information was reviewed. No changes were made in the programming.    

## 2013-10-09 NOTE — Progress Notes (Signed)
      Patient Care Team: Dina Rich, MD as PCP - General (Unknown Physician Specialty)   HPI  [name] is a 67 y.o. male Seen in followup for A. fib in 20 10,008 for what appears to be sinus node dysfunction first degree AV block.  History of ischemic heart disease prior bypass PCI and aortic valve replacement for bicuspid valve. This was remote in 1996.  His complaints over the last 3-4 weeks . It is relieved by rest and nitroglycerin.  Past Medical History  Diagnosis Date  . Bicuspid aortic valve 1996    S/p aortic valve replacement  . Coronary artery disease 1991    S/p PCI to left anterior descending  . S/P CABG (coronary artery bypass graft)     With LIMA to left anterior descending, vein grafts to first through third obtuse marginal and first diagonal  . 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 CLL (chronic lymphoblastic leukemia)     Partially treated due to financial issues    No past surgical history on file.  Current Outpatient Prescriptions  Medication Sig Dispense Refill  . ALPRAZolam (XANAX) 0.25 MG tablet Take 0.25 mg by mouth at bedtime as needed for anxiety.      Marland Kitchen aspirin 81 MG tablet Take 81 mg by mouth daily.        Marland Kitchen atorvastatin (LIPITOR) 40 MG tablet Take 40 mg by mouth daily.      Marland Kitchen buPROPion (WELLBUTRIN SR) 200 MG 12 hr tablet Take 200 mg by mouth daily.        Marland Kitchen ezetimibe (ZETIA) 10 MG tablet Take 10 mg by mouth daily.      . insulin aspart (NOVOLOG) 100 UNIT/ML injection Inject 30 Units into the skin 3 (three) times daily before meals.       . insulin glargine (LANTUS) 100 UNIT/ML injection Inject into the skin as directed.        . isosorbide mononitrate (IMDUR) 30 MG 24 hr tablet Take 90 mg by mouth daily.        . metoprolol (TOPROL-XL) 100 MG 24 hr tablet Take 150 mg by mouth daily.        Marland Kitchen NITROSTAT 0.4 MG SL tablet 0.4 mg every 5 (five) minutes as needed.       . sertraline (ZOLOFT) 50  MG tablet       . warfarin (COUMADIN) 7.5 MG tablet Take 7.5 mg by mouth daily.      Marland Kitchen zolpidem (AMBIEN) 5 MG tablet 5 mg at bedtime as needed.        No current facility-administered medications for this visit.    No Known Allergies  Review of Systems negative except from HPI and PMH  Physical Exam BP 131/71  Pulse 63  Ht 5\' 8"  (1.727 m)  Wt 213 lb 6.4 oz (96.798 kg)  BMI 32.46 kg/m2 Well developed and well nourished in no acute distress HENT normal E scleral and icterus clear Neck Supple JVP flat; carotids brisk and full Clear to ausculation Major asymmetry of his left chest and left breast. It seems to be separate from his device pocket. regular rate and rhythm, no murmurs gallops or rub Soft with active bowel sounds No clubbing cyanosis none Edema Alert and oriented, grossly normal motor and sensory function Skin Warm and Dry   ECG demonstrates AV pacing  Assessment and  Plan

## 2013-10-09 NOTE — Assessment & Plan Note (Signed)
Asymmetric chest and are. We'll start with CAT scan. FSA followup with Dr. Sol Passer his PCP to further evaluate this.

## 2013-10-09 NOTE — Assessment & Plan Note (Signed)
Device dependent in stable. With ventricular pacing and other symptoms we will get left ventricular function

## 2013-10-21 ENCOUNTER — Other Ambulatory Visit: Payer: Self-pay | Admitting: *Deleted

## 2013-10-21 ENCOUNTER — Encounter: Payer: Self-pay | Admitting: Internal Medicine

## 2013-10-21 DIAGNOSIS — N62 Hypertrophy of breast: Secondary | ICD-10-CM

## 2013-11-04 ENCOUNTER — Ambulatory Visit (HOSPITAL_BASED_OUTPATIENT_CLINIC_OR_DEPARTMENT_OTHER): Payer: Medicare Other | Admitting: Radiology

## 2013-11-04 ENCOUNTER — Other Ambulatory Visit (HOSPITAL_COMMUNITY): Payer: Self-pay | Admitting: Radiology

## 2013-11-04 ENCOUNTER — Encounter: Payer: Self-pay | Admitting: Cardiology

## 2013-11-04 ENCOUNTER — Ambulatory Visit (INDEPENDENT_AMBULATORY_CARE_PROVIDER_SITE_OTHER)
Admission: RE | Admit: 2013-11-04 | Discharge: 2013-11-04 | Disposition: A | Discharge status: Home / Self Care | Payer: Medicare Other | Source: Ambulatory Visit | Attending: Internal Medicine | Admitting: Internal Medicine

## 2013-11-04 ENCOUNTER — Ambulatory Visit (HOSPITAL_COMMUNITY): Payer: Medicare Other | Attending: Cardiology | Admitting: Radiology

## 2013-11-04 VITALS — BP 147/85 | Ht 68.0 in | Wt 214.0 lb

## 2013-11-04 DIAGNOSIS — R072 Precordial pain: Secondary | ICD-10-CM

## 2013-11-04 DIAGNOSIS — I079 Rheumatic tricuspid valve disease, unspecified: Secondary | ICD-10-CM | POA: Insufficient documentation

## 2013-11-04 DIAGNOSIS — R079 Chest pain, unspecified: Secondary | ICD-10-CM | POA: Insufficient documentation

## 2013-11-04 DIAGNOSIS — I059 Rheumatic mitral valve disease, unspecified: Secondary | ICD-10-CM | POA: Insufficient documentation

## 2013-11-04 DIAGNOSIS — I25810 Atherosclerosis of coronary artery bypass graft(s) without angina pectoris: Secondary | ICD-10-CM

## 2013-11-04 DIAGNOSIS — I2510 Atherosclerotic heart disease of native coronary artery without angina pectoris: Secondary | ICD-10-CM

## 2013-11-04 DIAGNOSIS — N62 Hypertrophy of breast: Secondary | ICD-10-CM

## 2013-11-04 DIAGNOSIS — I359 Nonrheumatic aortic valve disorder, unspecified: Secondary | ICD-10-CM

## 2013-11-04 MED ORDER — TECHNETIUM TC 99M SESTAMIBI GENERIC - CARDIOLITE
33.0000 | Freq: Once | INTRAVENOUS | Status: AC | PRN
  Administered 2013-11-04: 33 via INTRAVENOUS

## 2013-11-04 MED ORDER — ADENOSINE (DIAGNOSTIC) 3 MG/ML IV SOLN
0.5600 mg/kg | Freq: Once | INTRAVENOUS | Status: AC
  Administered 2013-11-04: 54.3 mg via INTRAVENOUS

## 2013-11-04 MED ORDER — TECHNETIUM TC 99M SESTAMIBI GENERIC - CARDIOLITE
11.0000 | Freq: Once | INTRAVENOUS | Status: AC | PRN
Start: 2013-11-04 — End: 2013-11-04
  Administered 2013-11-04: 11 via INTRAVENOUS

## 2013-11-04 NOTE — Progress Notes (Signed)
Echocardiogram performed.  

## 2013-11-04 NOTE — Progress Notes (Signed)
[name], [place] [address] [id number]    Cardiology Nuclear Med Study  [name] is a 68 y.o. male     MRN : [id number]     DOB: 12-05-1945  Procedure Date: 11/04/2013  Nuclear Med Background Indication for Stress Test:  Evaluation for Ischemia, Graft Patency and PTCA Patency History:  1991 PTCA LAD, PTVP, CATH- '96 CABG '10 ECHO Cardiac Risk Factors: Family History - CAD, Hypertension, IDDM, and Lipids  Symptoms:  Chest Pain   Nuclear Pre-Procedure Caffeine/Decaff Intake:  None > 12 hrs NPO After: 11:30pm   Lungs:  clear O2 Sat: 98% on room air. IV 0.9% NS with Angio Cath:  22g  IV Site: R Antecubital x 1, tolerated well IV Started by:  Irven Baltimore, RN  Chest Size (in):  44 Cup Size: n/a  Height: 5\' 8"  (1.727 m)  Weight:  214 lb (97.07 kg)  BMI:  Body mass index is 32.55 kg/(m^2). Tech Comments: No Lantus Insulin last nightdue to ran out per patient. No insulin today. Fasting CBG was 354 at 11:15 am per patient, asymptomatic. The patient will use his humalog insulin as soon as he can eat lunch.Irven Baltimore, RN.    Nuclear Med Study 1 or 2 day study: 1 day  Stress Test Type:  Adenosine  Reading MD: N/A  Order Authorizing Provider:  Virl Axe, MD  Resting Radionuclide: Technetium 30m Sestamibi  Resting Radionuclide Dose: 11.0 mCi   Stress Radionuclide:  Technetium 23m Sestamibi  Stress Radionuclide Dose: 33.0 mCi           Stress Protocol Rest HR: 60 Stress HR: 62  Rest BP: 147/85 Stress BP: 153/72  Exercise Time (min): n/a METS: n/a   Predicted Max HR: 151 bpm % Max HR: 41.06 bpm Rate Pressure Product: 9486   Dose of Adenosine (mg):  54.5 Dose of Lexiscan: n/a mg  Dose of Atropine (mg): n/a Dose of Dobutamine: n/a mcg/kg/min (at max HR)  Stress Test Technologist: Perrin Maltese, EMT-P  Nuclear Technologist:  Charlton Amor, CNMT     Rest Procedure:  Myocardial perfusion imaging was performed at  rest 45 minutes following the intravenous administration of Technetium 39m Sestamibi. Rest ECG: Paced rhythm  Stress Procedure:  The patient received IV adenosine at 140 mcg/kg/min for 4 minutes.  Technetium 70m Sestamibi was injected at the 2 minute mark and quantitative spect images were obtained after a 45 minute delay. Stress ECG: No significant change from baseline ECG  QPS Raw Data Images:  Normal; no motion artifact; normal heart/lung ratio. Stress Images:  Small area of mild decreased uptake affecting the basis/mid antero-septal segments and the apical septal segment Rest Images:  The rest images are the same as the stress images. Subtraction (SDS):  No significant ischemia. Transient Ischemic Dilatation (Normal <1.22):  1.14 Lung/Heart Ratio (Normal <0.45):  0.37  Quantitative Gated Spect Images QGS EDV:  181 ml QGS ESV:  117 ml  Impression Exercise Capacity:  Adenosine study with no exercise. BP Response:  Normal blood pressure response. Clinical Symptoms:  Mild chest pressure and shortness of breath ECG Impression:  No significant ST segment change suggestive of ischemia. Comparison with Prior Nuclear Study: No images to compare  Overall Impression:  The left ventricle is dilated. There is decreased left ventricular function. There is decreased activity in the area of the anterior septum. Some of this may be related to the paced rhythm or possibly from old  injury to this area. The area is fixed. Overall there is no significant ischemia.  LV Ejection Fraction: 35%.  LV Wall Motion:  Akinesis of the septum and severe hypokinesis of the inferior wall.  [name]   .

## 2013-11-05 ENCOUNTER — Encounter: Payer: Self-pay | Admitting: Cardiovascular Disease

## 2013-11-27 ENCOUNTER — Encounter: Payer: Self-pay | Admitting: Internal Medicine

## 2013-11-27 ENCOUNTER — Ambulatory Visit (INDEPENDENT_AMBULATORY_CARE_PROVIDER_SITE_OTHER): Payer: Medicare Other | Admitting: Internal Medicine

## 2013-11-27 VITALS — BP 125/65 | HR 82 | Wt 210.8 lb

## 2013-11-27 DIAGNOSIS — I255 Ischemic cardiomyopathy: Secondary | ICD-10-CM | POA: Insufficient documentation

## 2013-11-27 DIAGNOSIS — I2589 Other forms of chronic ischemic heart disease: Secondary | ICD-10-CM

## 2013-11-27 DIAGNOSIS — I442 Atrioventricular block, complete: Secondary | ICD-10-CM

## 2013-11-27 DIAGNOSIS — I4891 Unspecified atrial fibrillation: Secondary | ICD-10-CM

## 2013-11-27 LAB — MDC_IDC_ENUM_SESS_TYPE_INCLINIC
Battery Voltage: 2.71 V
Brady Statistic AS VP Percent: 1 %
Brady Statistic AS VS Percent: 0 %
Date Time Interrogation Session: 20150129162331
Lead Channel Impedance Value: 415 Ohm
Lead Channel Impedance Value: 441 Ohm
Lead Channel Pacing Threshold Amplitude: 0.5 V
Lead Channel Pacing Threshold Pulse Width: 0.4 ms
Lead Channel Setting Pacing Amplitude: 2.5 V
Lead Channel Setting Pacing Pulse Width: 0.4 ms
Lead Channel Setting Sensing Sensitivity: 2 mV
MDC IDC MSMT BATTERY IMPEDANCE: 2088 Ohm
MDC IDC MSMT BATTERY REMAINING LONGEVITY: 21 mo
MDC IDC MSMT LEADCHNL RA SENSING INTR AMPL: 4 mV
MDC IDC MSMT LEADCHNL RV PACING THRESHOLD AMPLITUDE: 0.5 V
MDC IDC MSMT LEADCHNL RV PACING THRESHOLD PULSEWIDTH: 0.4 ms
MDC IDC SET LEADCHNL RA PACING AMPLITUDE: 2 V
MDC IDC STAT BRADY AP VP PERCENT: 99 %
MDC IDC STAT BRADY AP VS PERCENT: 0 %

## 2013-11-27 MED ORDER — LOSARTAN POTASSIUM 50 MG PO TABS: 50.0000 mg | ORAL_TABLET | Freq: Every day | ORAL | Status: AC

## 2013-11-27 NOTE — Progress Notes (Signed)
Patient Care Team: Teressa Lower, MD as PCP - General (Unknown Physician Specialty)   HPI  [name] is a 68 y.o. male Seen in followup for atrial fibrillation in conjunction with sinus node dysfunction and first degree AV block. He also has a history of ischemic heart disease with prior bypass surgery PCI and aortic valve replacement for bicuspid valve. This was done 1996.  He was seen a few weeks ago with complaints of chest pain and shortness of breath.  There've been interval decrease in LV function to 45-50% a bioprosthetic valve in aortic position was noted and was functioning normally. He has severe left atrial enlargement (58/2.75) Myoview scanning confirmed depression of LV function with an ejection fraction of 35%. There is no significant ischemia. There is evidence of an old anterior perfusion defect with inferior wall hypokinesis  jhe denies chest pain or shortness of breath at this point  Past Medical History  Diagnosis Date  . Bicuspid aortic valve 1996    S/p aortic valve replacement  . Coronary artery disease 1991    S/p PCI to left anterior descending  . S/P CABG (coronary artery bypass graft)     With LIMA to left anterior descending, vein grafts to first through third obtuse marginal and first diagonal  . 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 CLL (chronic lymphoblastic leukemia)     Partially treated due to financial issues    No past surgical history on file.  Current Outpatient Prescriptions  Medication Sig Dispense Refill  . ALPRAZolam (XANAX) 0.25 MG tablet Take 0.25 mg by mouth at bedtime as needed for anxiety.      Marland Kitchen aspirin 81 MG tablet Take 81 mg by mouth daily.        Marland Kitchen atorvastatin (LIPITOR) 40 MG tablet Take 40 mg by mouth daily.      Marland Kitchen buPROPion (WELLBUTRIN SR) 200 MG 12 hr tablet Take 200 mg by mouth daily.        Marland Kitchen enoxaparin (LOVENOX) 100 MG/ML injection 100 mg daily.      Marland Kitchen ezetimibe  (ZETIA) 10 MG tablet Take 10 mg by mouth daily.      Marland Kitchen HUMALOG KWIKPEN 100 UNIT/ML KiwkPen As directed      . insulin aspart (NOVOLOG) 100 UNIT/ML injection Inject 10 Units into the skin 3 (three) times daily before meals.       . insulin glargine (LANTUS) 100 UNIT/ML injection Inject into the skin as directed.        . isosorbide mononitrate (IMDUR) 30 MG 24 hr tablet Take 90 mg by mouth daily.        . metoprolol (TOPROL-XL) 100 MG 24 hr tablet Take 150 mg by mouth daily.        Marland Kitchen NITROSTAT 0.4 MG SL tablet 0.4 mg every 5 (five) minutes as needed.       . sertraline (ZOLOFT) 50 MG tablet       . warfarin (COUMADIN) 7.5 MG tablet Take 7.5 mg by mouth daily.      Marland Kitchen zolpidem (AMBIEN) 5 MG tablet 5 mg at bedtime as needed.        No current facility-administered medications for this visit.    No Known Allergies  Review of Systems negative except from HPI and PMH  Physical Exam BP 125/65  Pulse 82  Wt 210 lb 12.8 oz (95.618 kg) Well developed and well nourished in  no acute distress HENT normal E scleral and icterus clear Neck Supple JVP flat; carotids brisk and full Clear to ausculation  Regular rate and rhythm, loud S2  2/6 systolic murmur Soft with active bowel sounds No clubbing cyanosis Trace Edema Alert and oriented, grossly normal motor and sensory function Skin Warm and Dry    Assessment and  Plan

## 2013-11-27 NOTE — Assessment & Plan Note (Signed)
The patient has evidence of a cardiomyopathy with an ejection fraction of 35-45%. We do not have interval data to know how long this has been an issue. With his diabetes, we will begin him on an ARB as was intolerant of ACE inhibitors with a cough. In the event that he is able to tolerate this without significant hyperkalemia we'll anticipate the addition of eplerenone not using Aldactone because of her prior history of gynecomastia.  We were getting his records from [facility].

## 2013-11-27 NOTE — Patient Instructions (Addendum)
Your physician has recommended you make the following change in your medication:  1) Start Cozaar 50 mg daily  Your physician recommends that you have lab work in 2 weeks for [employer].   Your physician recommends that you schedule a follow-up appointment in: 4 weeks with Scott Weaver/Brooke Edmisten, PAC (to possibly begin eplerenone)  Remote monitoring is used to monitor your Pacemaker of ICD from home. This monitoring reduces the number of office visits required to check your device to one time per year. It allows [place] to keep an eye on the functioning of your device to ensure it is working properly. You are scheduled for a device check from home on 03/03/14. You may send your transmission at any time that day. If you have a wireless device, the transmission will be sent automatically. After your physician reviews your transmission, you will receive a postcard with your next transmission date.   We will request records from [facility]

## 2013-12-01 ENCOUNTER — Encounter: Payer: Self-pay | Admitting: Internal Medicine

## 2013-12-25 ENCOUNTER — Ambulatory Visit: Payer: Medicare Other | Admitting: Physician Assistant

## 2014-01-28 DEATH — deceased

## 2014-03-03 ENCOUNTER — Encounter: Payer: Medicare Other | Admitting: *Deleted

## 2014-04-15 ENCOUNTER — Telehealth: Payer: Self-pay

## 2014-04-15 NOTE — Telephone Encounter (Signed)
Patient past away @ [facility]

## 2015-08-21 IMAGING — CT CT CHEST W/O CM
2 of 3 series · 15 of 36 positions shown, 18 images · IV contrast (Omnipaque 300)
Comparison: None.

CLINICAL DATA: Evaluate gynecomastia, asymmetric chest.

EXAM:
CT CHEST WITHOUT CONTRAST
TECHNIQUE: Multidetector CT imaging of the chest was performed following the
standard protocol without IV contrast.

[Series 2: chest routine with · axial · 0.81mm/px · z∈[-271,-11]mm · 12 of 62 slices shown, 15 images]
[im 5/62  mediastinal]
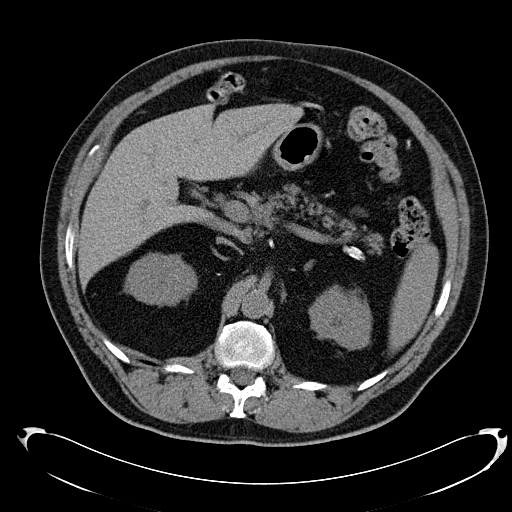
[im 5/62  lung]
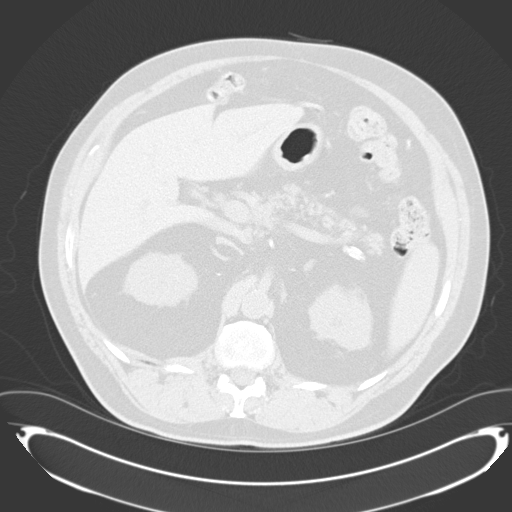
[im 10/62  lung]
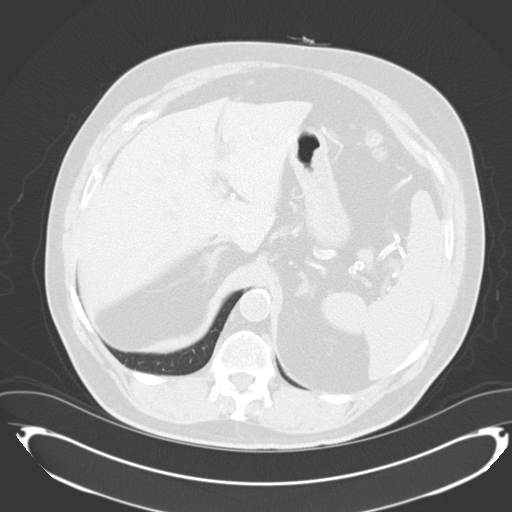
[im 14/62  lung]
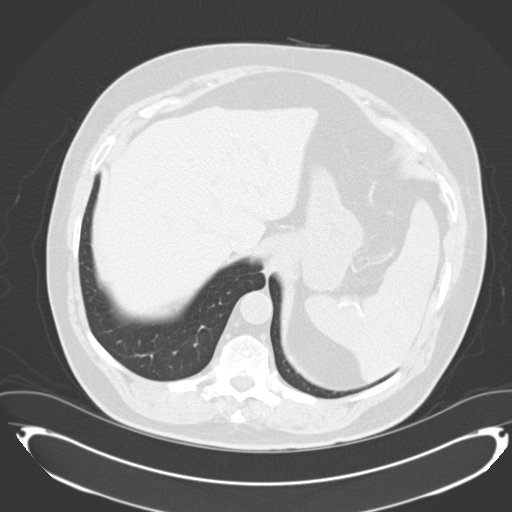
[im 19/62  lung]
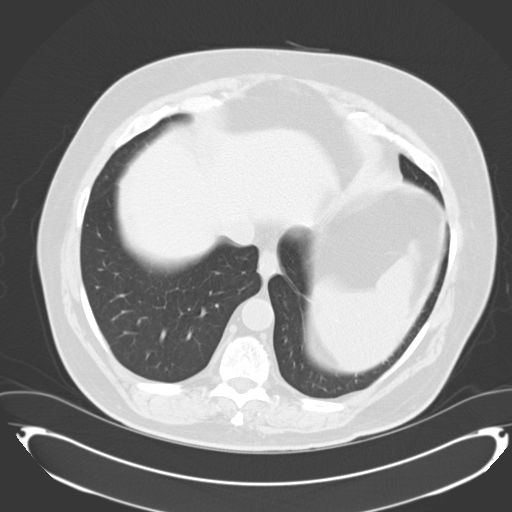
[im 23/62  mediastinal]
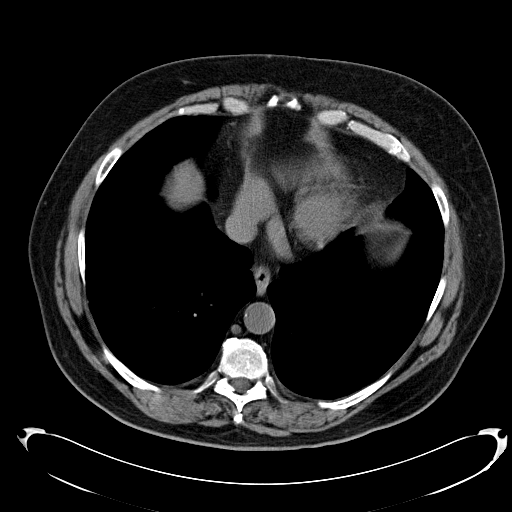
[im 23/62  lung]
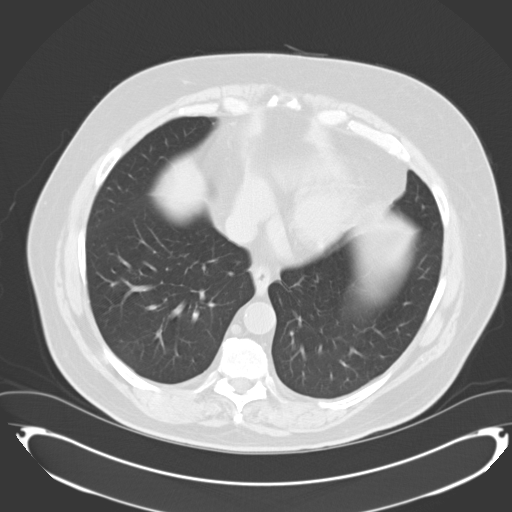
[im 28/62  lung]
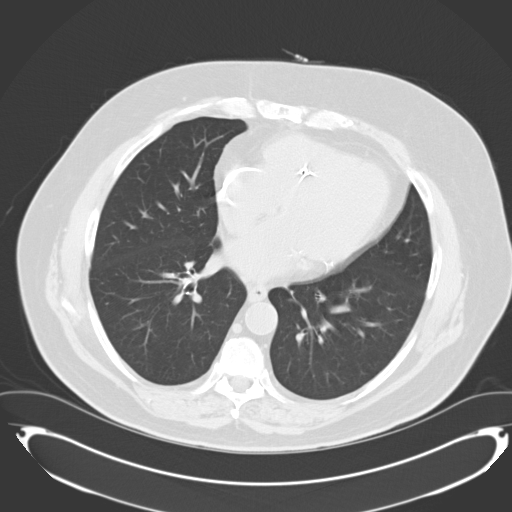
[im 34/62  lung]
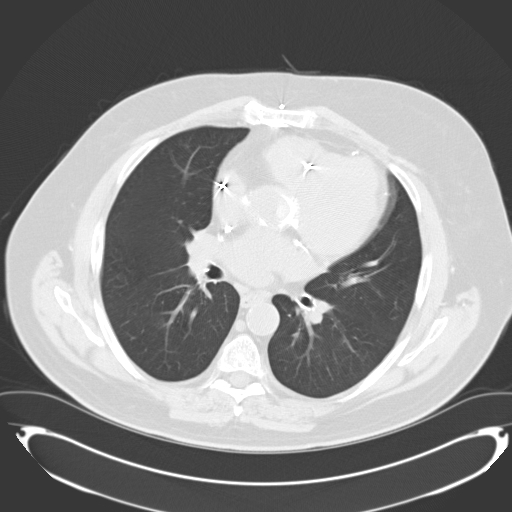
[im 39/62  lung]
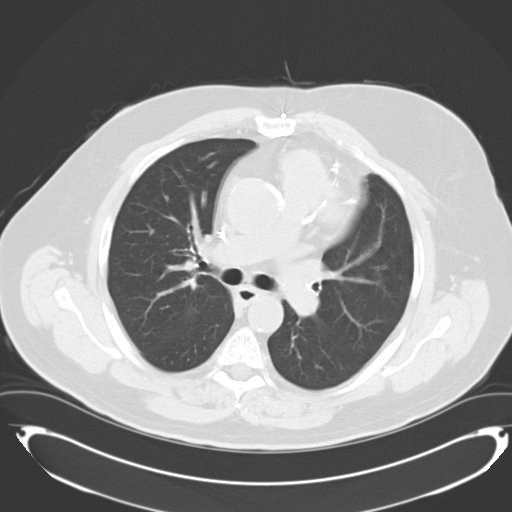
[im 43/62  mediastinal]
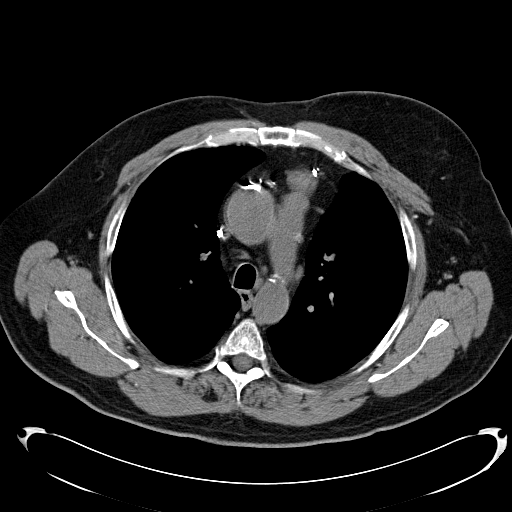
[im 43/62  lung]
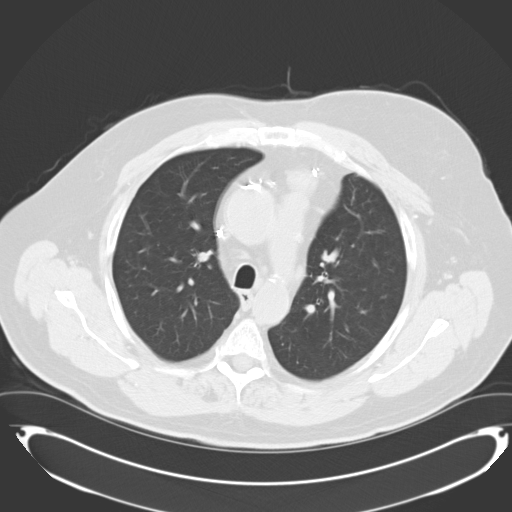
[im 48/62  lung]
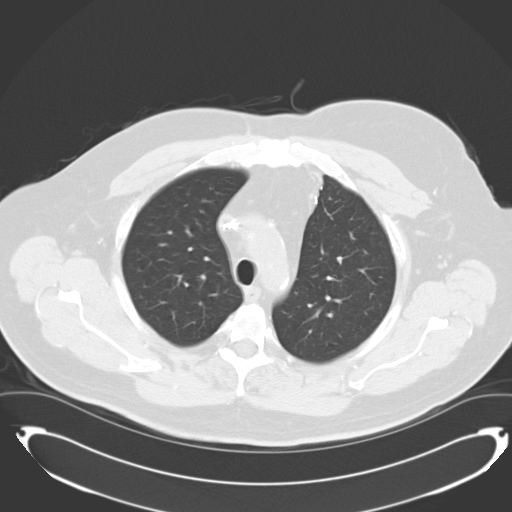
[im 52/62  lung]
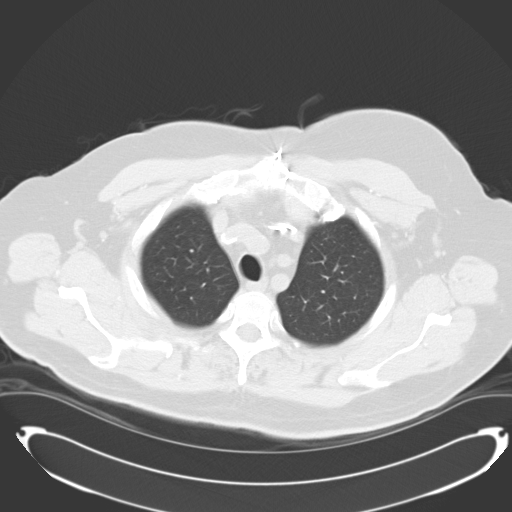
[im 57/62  lung]
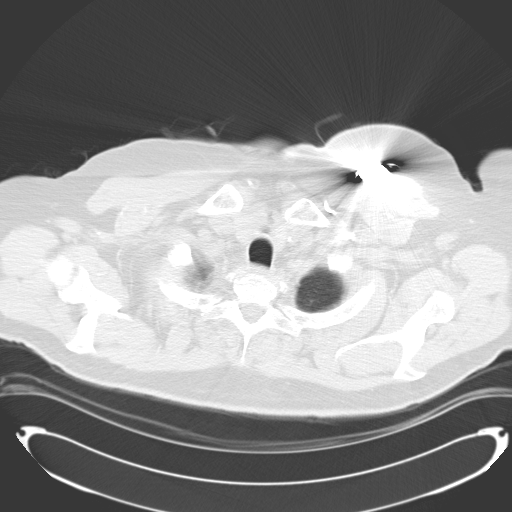

[Series 602: cor · coronal · 0.81mm/px · 3 of 125 slices shown]
[im 25/125  lung]
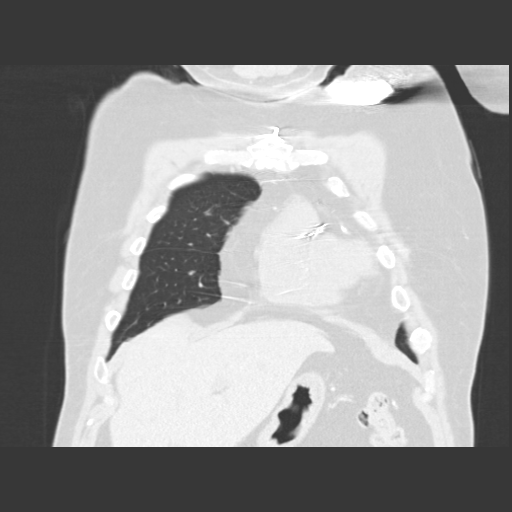
[im 50/125  lung]
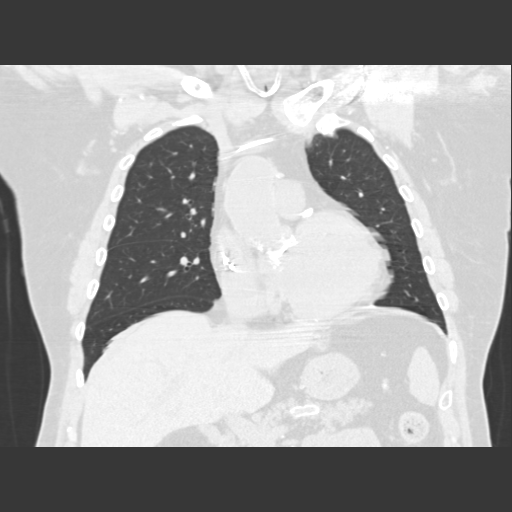
[im 75/125  lung]
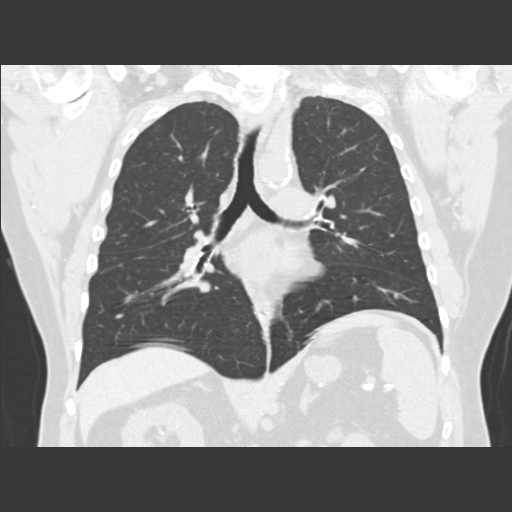

[15 of 36 positions shown; findings below may reference images not displayed]

FINDINGS: There is no pleural effusion. The lungs are clear. No airspace
consolidation or atelectasis noted. Pulmonary nodule in the left
lower lobe measures 4 mm, image 37/series 3.

Previous median sternotomy and CABG procedure. There is a left chest
wall pacer device with leads in the right atrial appendage and right
ventricle. Mild cardiac enlargement. No mediastinal or hilar
adenopathy. There is no axillary or supraclavicular adenopathy
noted.

There is no evidence for gynecomastia. Asymmetric enlargement of the
left chest wall compared with right is likely due to excess adipose
tissue and/or possible underline lipoma. No suspicious soft tissue
mass identified.

Incidental imaging through the upper abdomen shows extensive
vascular calcifications.

Review of the visualized bony structures is significant for mild
spondylosis of the thoracic spine.
IMPRESSION: 1. No acute findings.
2. Asymmetric enlargement of the left chest wall likely reflects
underlying at of post tissue hypertrophy or lipoma. No solid soft
tissue mass identified. No evidence for gynecomastia.
3. Pulmonary nodule mild left lower lobe measures 4 mm. If the
patient is at high risk for bronchogenic carcinoma, follow-up chest
CT at 4year is recommended. If the patient is at low risk, no
follow-up is needed. This recommendation follows the consensus
statement: Guidelines for Management of Small Pulmonary Nodules
Detected on CT Scans: A Statement from the [HOSPITAL] as
